# Patient Record
Sex: Male | Born: 1965 | Race: White | Hispanic: No | Marital: Married | Smoking: Never smoker
Health system: Southern US, Community
[De-identification: ages and names within clinical notes are randomized; demographics above are authoritative.]

---

## 1998-09-09 ENCOUNTER — Emergency Department (HOSPITAL_COMMUNITY): Admission: EM | Admit: 1998-09-09 | Discharge: 1998-09-09 | Payer: Self-pay

## 2005-10-09 ENCOUNTER — Encounter: Admission: RE | Admit: 2005-10-09 | Discharge: 2005-10-09 | Payer: Self-pay | Admitting: Internal Medicine

## 2005-10-10 ENCOUNTER — Ambulatory Visit: Payer: Self-pay | Admitting: Pulmonary Disease

## 2005-10-10 ENCOUNTER — Inpatient Hospital Stay (HOSPITAL_COMMUNITY): Admission: EM | Admit: 2005-10-10 | Discharge: 2005-10-18 | Payer: Self-pay | Admitting: Emergency Medicine

## 2005-10-10 ENCOUNTER — Encounter (INDEPENDENT_AMBULATORY_CARE_PROVIDER_SITE_OTHER): Payer: Self-pay | Admitting: Specialist

## 2005-10-12 ENCOUNTER — Encounter: Payer: Self-pay | Admitting: Thoracic Surgery

## 2005-10-13 ENCOUNTER — Encounter (INDEPENDENT_AMBULATORY_CARE_PROVIDER_SITE_OTHER): Payer: Self-pay | Admitting: *Deleted

## 2005-10-16 ENCOUNTER — Ambulatory Visit: Payer: Self-pay | Admitting: Dentistry

## 2005-10-16 ENCOUNTER — Encounter (INDEPENDENT_AMBULATORY_CARE_PROVIDER_SITE_OTHER): Payer: Self-pay | Admitting: Specialist

## 2005-10-17 ENCOUNTER — Encounter (INDEPENDENT_AMBULATORY_CARE_PROVIDER_SITE_OTHER): Payer: Self-pay | Admitting: *Deleted

## 2005-10-25 ENCOUNTER — Encounter: Admission: RE | Admit: 2005-10-25 | Discharge: 2005-10-25 | Payer: Self-pay | Admitting: Thoracic Surgery

## 2005-11-14 ENCOUNTER — Encounter: Admission: RE | Admit: 2005-11-14 | Discharge: 2005-11-14 | Payer: Self-pay | Admitting: Thoracic Surgery

## 2005-12-01 ENCOUNTER — Ambulatory Visit (HOSPITAL_COMMUNITY): Admission: RE | Admit: 2005-12-01 | Discharge: 2005-12-01 | Payer: Self-pay | Admitting: Thoracic Surgery

## 2005-12-01 ENCOUNTER — Encounter (INDEPENDENT_AMBULATORY_CARE_PROVIDER_SITE_OTHER): Payer: Self-pay | Admitting: Specialist

## 2006-09-24 IMAGING — CR DG CHEST 2V
2 series · 2 of 2 positions shown · non-contrast
Comparison: 11/14/05.

CLINICAL DATA: Lung cancer, pre-op bronchoscopy.  
CHEST ? 2 VIEW:

[view not recorded (1 of 2)]
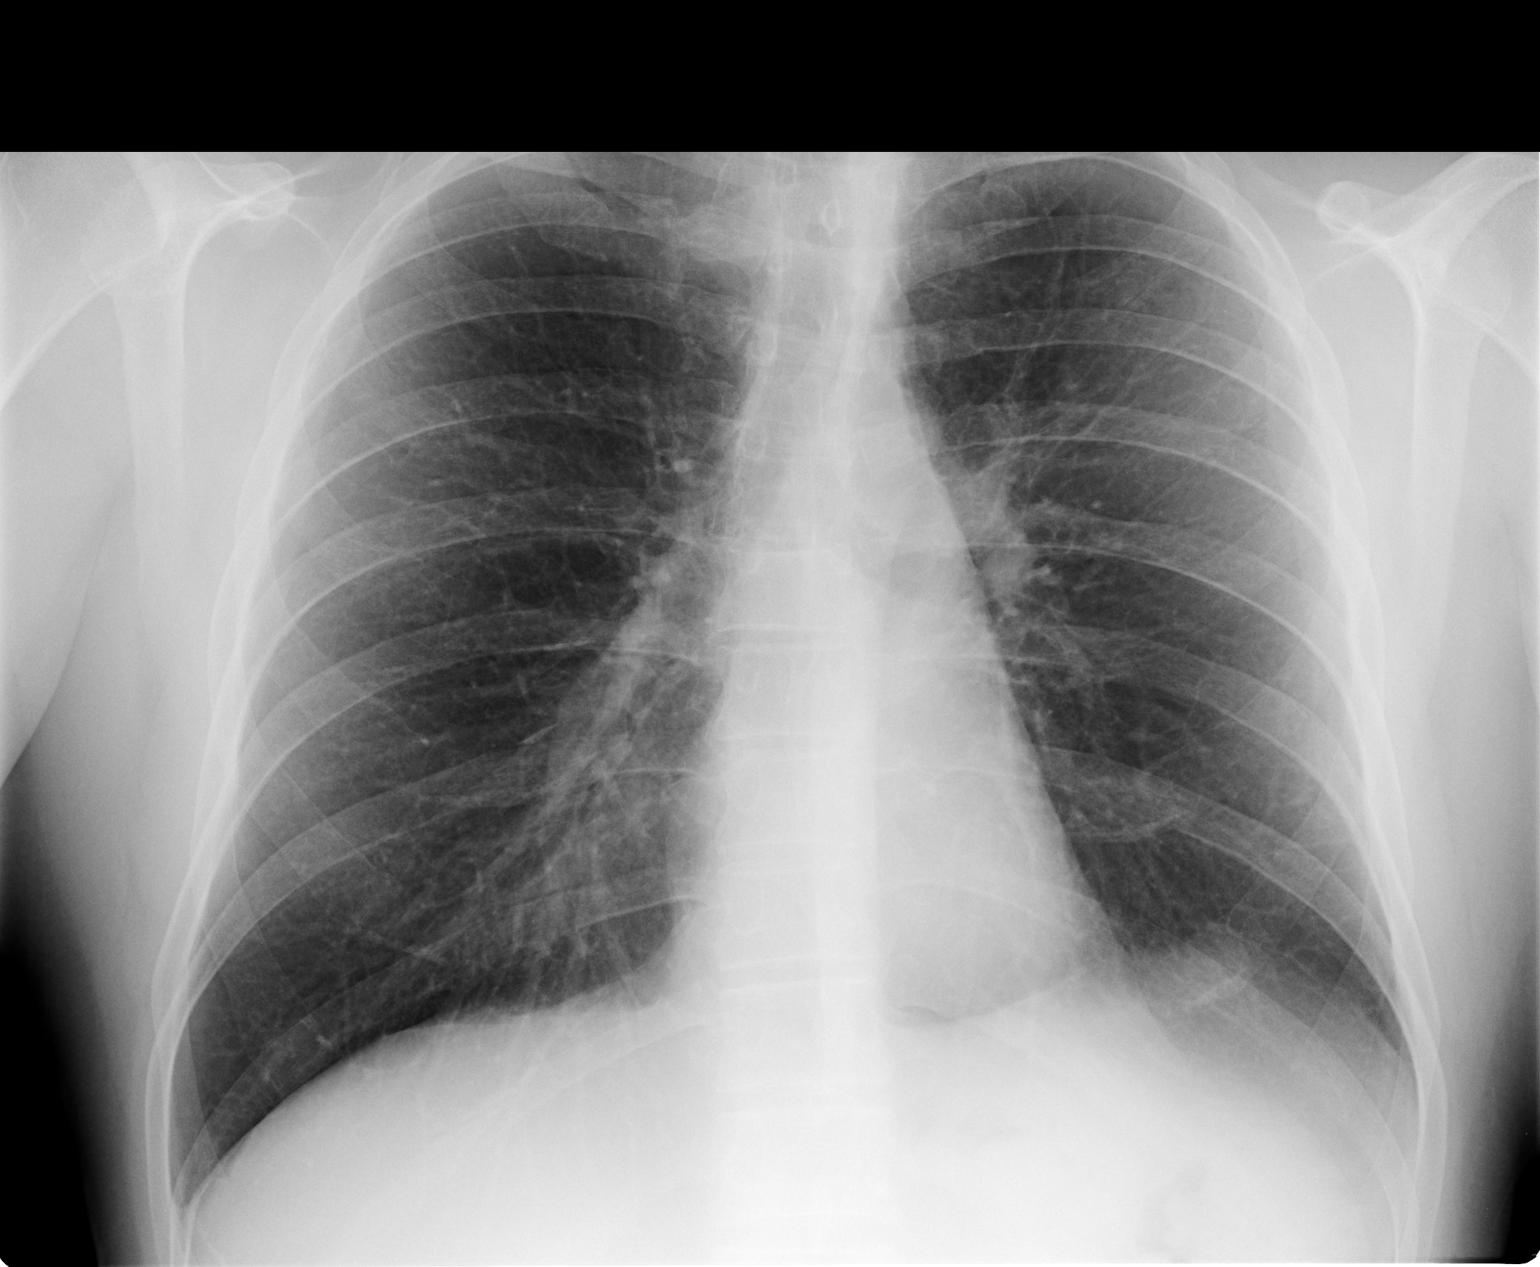

[view not recorded (2 of 2)]
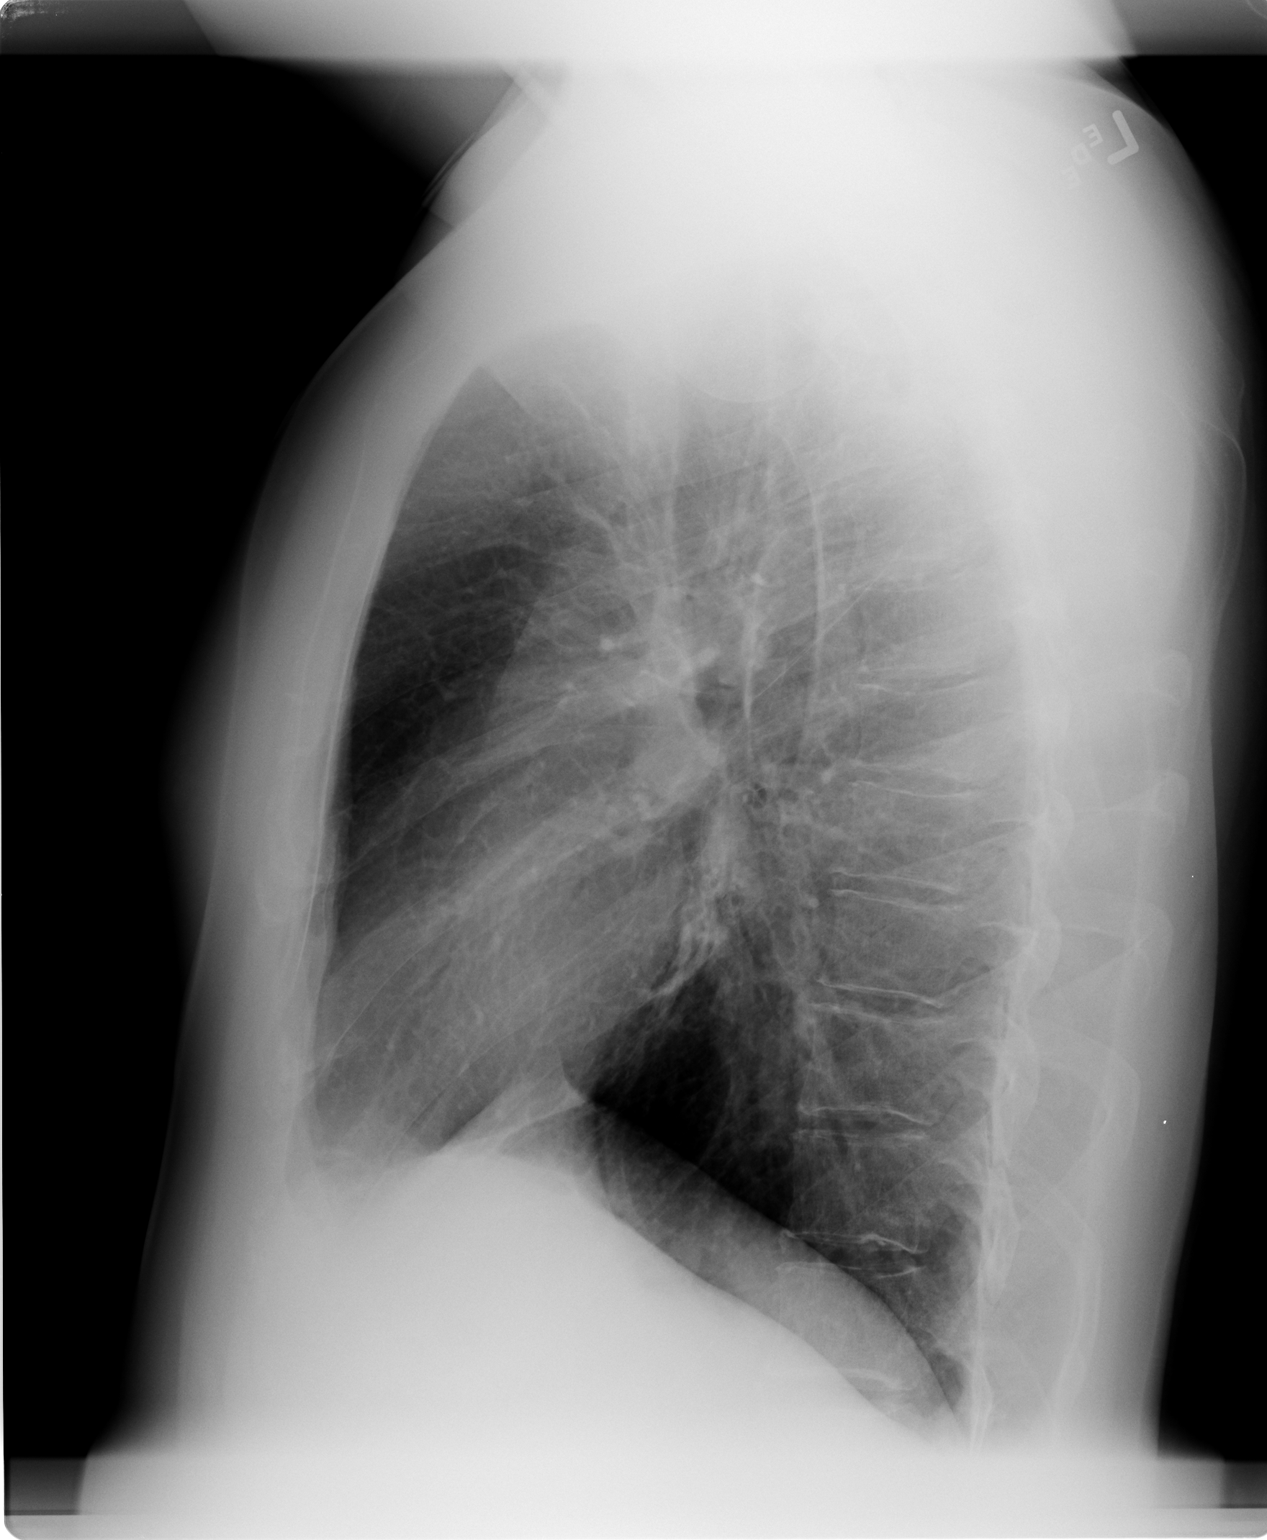

[2 of 2 positions shown; findings below may reference images not displayed]

FINDINGS: Left hilar lesion is better seen on CT chest 10/09/05.  There is mild postobstructive pneumonits in the left upper lobe.  The lungs are otherwise clear.  No pleural fluid.
IMPRESSION: Left hilar lesion is better seen on CT chest 10/09/05.  Mild left upper lobe postobstructive pneumonitis.

## 2007-01-10 ENCOUNTER — Ambulatory Visit: Payer: Self-pay | Admitting: Thoracic Surgery

## 2007-01-10 ENCOUNTER — Encounter: Admission: RE | Admit: 2007-01-10 | Discharge: 2007-01-10 | Payer: Self-pay | Admitting: Thoracic Surgery

## 2007-12-11 ENCOUNTER — Ambulatory Visit: Payer: Self-pay | Admitting: Thoracic Surgery

## 2007-12-11 ENCOUNTER — Encounter: Admission: RE | Admit: 2007-12-11 | Discharge: 2007-12-11 | Payer: Self-pay | Admitting: Thoracic Surgery

## 2008-12-09 ENCOUNTER — Ambulatory Visit: Payer: Self-pay | Admitting: Thoracic Surgery

## 2008-12-09 ENCOUNTER — Encounter: Admission: RE | Admit: 2008-12-09 | Discharge: 2008-12-09 | Payer: Self-pay | Admitting: Thoracic Surgery

## 2009-09-14 ENCOUNTER — Encounter: Admission: RE | Admit: 2009-09-14 | Discharge: 2009-09-14 | Payer: Self-pay | Admitting: Internal Medicine

## 2009-12-08 ENCOUNTER — Encounter: Admission: RE | Admit: 2009-12-08 | Discharge: 2009-12-08 | Payer: Self-pay | Admitting: Thoracic Surgery

## 2009-12-08 ENCOUNTER — Ambulatory Visit: Payer: Self-pay | Admitting: Thoracic Surgery

## 2010-04-17 ENCOUNTER — Encounter: Payer: Self-pay | Admitting: Internal Medicine

## 2010-08-09 NOTE — Assessment & Plan Note (Signed)
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  His blood  pressure is 111/78, pulse 100, respirations 18, and sats were 98%.  CT  scan was negative.  He has had no symptoms since then.  I will plan to  see him back again in 1 year with another CT scan.   [name], M.D.  Electronically Signed   DPB/MEDQ  D:  12/11/2007  T:  12/11/2007  Job:  [id number]

## 2010-08-09 NOTE — Assessment & Plan Note (Signed)
OFFICE VISIT   DRADEN, COTTINGHAM  DOB:  Aug 10, 1965                                        January 10, 2007  CHART #:  [id number]   The patient came.  Taylor Owens was supposed to be followed by Dr. Marcos Eke but  since she left town I saw him back today and got a CT scan and there is  no evidence of recurrence of his carcinoid tumor.  Taylor Owens is having some  migratory chest pain that sounds like chest wall pain.  His lungs were  clear to auscultation and percussion.  Blood pressure 121/75.  Pulse 90.  Respirations 18.  Sats are 91%.  I plan to see him back again in 6  months with a chest x-ray.   [name], M.D.  Electronically Signed   DPB/MEDQ  D:  01/10/2007  T:  01/11/2007  Job:  [id number]

## 2010-08-09 NOTE — Assessment & Plan Note (Signed)
OFFICE VISIT   Taylor Owens, Taylor Owens  DOB:  Jan 24, 1966                                        December 09, 2008  CHART #:  [id number]   His blood pressure was 125/77, pulse 89, respirations 18, sats were 98%.  Lungs clear to auscultation and percussion.   We will see him back again in 1 year with a CT scan.  His CT scan today  showed no evidence of recurrence.  He is now over 3 years since we  removed his endobronchial tumor.   [name], M.D.  Electronically Signed   DPB/MEDQ  D:  12/09/2008  T:  12/10/2008  Job:  [id number]

## 2010-08-09 NOTE — Assessment & Plan Note (Signed)
OFFICE VISIT   Taylor Owens, Taylor Owens  DOB:  [birth year]                                        December 08, 2009  CHART #:  [id number]   HISTORY OF PRESENT ILLNESS:  The patient is status post removal of  endobronchial carcinoid with laser bronchoscopy done on December 01, 2005.  The patient was last seen in the office by Dr. Edwyna Shell on  December 09, 2008.  He presents back today for his yearly followup  visit.  The patient states he has recently seen his primary care  physician due to complaints of some mild chest pain and shortness of  breath when temperatures are extremely hot.  At that time, his  practitioner told him to take a decongestant and the patient states this  did clear up.  Otherwise, the patient states he is feeling well.  Denies  any fevers, nausea, vomiting, changes in weight.   PHYSICAL EXAMINATION:  Vital Signs:  Blood pressure 131/85, pulse 92,  respirations of 16, O2 sats 98% on room air.  Respiratory:  Clear to  auscultation bilaterally.  Cardiac:  Regular rate and rhythm.   STUDIES:  The patient had a CT scan of the chest without contrast which  showed no acute findings.   IMPRESSION AND PLAN:  The patient noted to be stable at this time.  He  was seen and evaluated by Dr. Edwyna Shell.  His CT scan shows no recurrence.  Dr. Edwyna Shell will plan to follow up with the patient one more time with  repeat CT scan of the chest, and at that time will be 5 years postop.  The patient is told if he has any questions or develops any increasing  shortness of breath or weight loss or chest pain he is to contact us.  The patient is in agreement.   Sol Blazing, PA   KMD/MEDQ  D:  12/08/2009  T:  12/09/2009  Job:  [id number]

## 2010-08-12 NOTE — Discharge Summary (Signed)
NAMEEIN, RIJO NO.:  [id number]   MEDICAL RECORD NO.:  [id number]          PATIENT TYPE:  INP   LOCATION:  2014                         FACILITY:  [facility]   PHYSICIAN:  Shan Levans, M.D. 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   HISTORY OF PRESENT ILLNESS:  Mr. Taylor Owens is a 45 year old white male,  who for several weeks had increased cough, a lump-like sensation to the  point where he has had hemoptysis in the evening with pleuritic chest pain  and acute respiratory distress.  He was initially brought to the [facility] Emergency Department.  He required IV morphine, 100% non-re-  breather to maintain proper saturation.  Chest x-ray and CT scan  demonstrated collapse of the left lung with left main stem interbronchial  lesion.  Of note, the patient was an active smoker.  Pulmonary critical care  was called in to assess and further evaluate his left main stem bronchus  lesion.   LABORATORY DATA:  Respiratory culture shows no growth at 2 days.  Arterial  blood gas - pH 7.40, PCO2 33, with bicarb of 21 with base deficit of 3.  WBC  6.2, hemoglobin 12.3, hematocrit 36.5 and platelets 298.  INR 1.  PTT 27.  Sodium 140, potassium 3.9, chloride 106, CO2 is 30, glucose 88, BUN 3,  creatinine 0.7, calcium 8.8, AST 26, ALT 33, ALP is 86, total bilirubin 0.5,  respiratory culture again demonstrates no growth.  Pathology results from  left main stem bronchus biopsy demonstrates a carcinoid tumor.  Chest x-ray  shows no pneumothorax following thoracentesis.  A 12-lead EKG is normal  sinus rhythm with heart rate 101.   HOSPITAL COURSE BY DISCHARGE DIAGNOSES:  1. Left main stem bronchus blockage.  The patient was taken initially to      endoscopic lab on July 17th and  fiberoptic bronchoscopy per Dr. Danice Goltz was performed with airway inspection and observation of      carcinoid tumor in the left main stem that was blocking and creating a      collapse.  At this time, he was set up for a followup in the operating      room with Dr. Edwyna Shell in attendance.  Initially had excision of      carcinoid tumor of left main stem with snare and cautery.  Dr. Edwyna Shell      then performed a laser resection of the stump.  This was done to the      left main stem bronchus stump.  He was successfully awakened from      anesthesia and transferred to post-anesthesia care unit.  He required      ultrasound-guided thoracentesis on July 24 to remove 400 mL of straw      fluid from the left chest.  His hemoptysis and chest pain have  improved.  He has reached maximum hospital benefit.  He will be      discharged to home in improved condition today.  He will follow up with      Dr. Danice Goltz on August 1 at 9:50 a.m. for further evaluation and      treatment and Dr. Edwyna Shell within 1 week in his office, will call and set      this appointment up.  2. Left inguinal node which he underwent ultrasound biopsy on March 25.      This was found to have spindle cells in it.  He had no further      complications from left inguinal node.  He is being again discharged      home in improved condition.   DISCHARGE MEDICATIONS:  1. Avelox 400 mg a day until gone.  2. Advair 250/50, 1 puff b.i.d.  3. Amitriptyline 50 mg q.h.s.  4. Valium 5 mg, 1-1/2 tablets daily p.r.n.   He has a followup appointment with Dr. Jayme Cloud on August 1 at 9:50 a.m. and  with Dr. Edwyna Shell in 1 week.   DIET:  As tolerated.   SPECIAL INSTRUCTIONS:  No smoking.   DISPOSITION/CONDITION ON DISCHARGE:  Improved.      Brett Canales Minor, A.C.N.P. [facility]      Shan Levans, M.D. [facility]  Electronically Signed    SM/MEDQ  D:  10/18/2005  T:  10/18/2005  Job:  [id number]

## 2010-08-12 NOTE — Op Note (Signed)
NAMEMCARTHUR, IVINS               ACCOUNT NO.:  [id number]   MEDICAL RECORD NO.:  [id number]          PATIENT TYPE:  OUT   LOCATION:  XRAY                         FACILITY:  [facility]   PHYSICIAN:  [name], M.D. DATE OF BIRTH:  November 24, 1965   DATE OF PROCEDURE:  DATE OF DISCHARGE:  10/12/2005                                 OPERATIVE REPORT   PREOPERATIVE DIAGNOSES:  Atelectasis, left lung secondary to endobronchial  carcinoid.   POSTOPERATIVE DIAGNOSES:  Atelectasis, left lung secondary to endobronchial  carcinoid.   OPERATION PERFORMED:  Removal of endobronchial carcinoid with laser  bronchoscopy.   SURGEON:  [name], M.D. and also Danice Goltz, M.D. [facility]   After general anesthesia, the video bronchoscope was passed through the  endotracheal tube.  The distal left main stem bronchus, the right upper  lobe, right middle lobe and right lower lobe orifices were normal.  The  carina was in the midline.  The left main stem had an almost total  obstruction of the left main stem bronchus.  We were able to get around it  and see the left lower lobe and remove some secretions from the left lower  lobe.  Then, Dr. Jayme Cloud did an endobronchial resection of these.  The  tumor, using a snare with electrocautery, when we were able to get down to  the base, and at that time, we came back with the Nd:YAG laser set at 25  watt seconds continuous, free beam style and lasered the base of this.  The  lesion came off the subcarinal on the right upper lobes side.  The patient  tolerated the procedure well, was returned to recovery room in stable  condition.           ______________________________  [name], M.D.     DPB/MEDQ  D:  10/13/2005  T:  10/13/2005  Job:  [id number]   cc:   Danice Goltz, M.D. [facility]  [address] [place], [place] [address]

## 2010-08-12 NOTE — Op Note (Signed)
Taylor Owens, Taylor Owens               ACCOUNT NO.:  [id number]   MEDICAL RECORD NO.:  [id number]          PATIENT TYPE:  AMB   LOCATION:  SDS                          FACILITY:  [facility]   PHYSICIAN:  [name], M.D. DATE OF BIRTH:  November 04, 1965   DATE OF PROCEDURE:  12/01/2005  DATE OF DISCHARGE:                                 OPERATIVE REPORT   PREOPERATIVE DIAGNOSIS:  Status post excision of endobronchial carcinoid  tumor.   POSTOPERATIVE DIAGNOSIS:  Status post excision of endobronchial carcinoid  tumor.   OPERATION PERFORMED:  Video bronchoscopy with laser standby.   SURGEON:  [name], M.D.   ANESTHESIA:  General anesthesia.   DESCRIPTION OF PROCEDURE:  Under general anesthesia, the video bronchoscope  was passed through the endotracheal tube.  The carina was in the midline.  The right middle lobe, right lower lobe and right upper lobe orifices were  normal.  The right main stem bronchus was normal.  The left main stem  bronchus was normal down to subcarina between the left upper lobe and left  lower lobe orifices; there was no evidence of tumor.  This was where it  originated originally, which we had excised and then lasered the stump.  The  left upper lobe orifices were normal, and the right and left lower lobe  orifices.  Pictures were taken and biopsies were taken of the area where the  stump had been.  The patient was returned to the recovery room in stable  condition.           ______________________________  [name], M.D.     DPB/MEDQ  D:  12/01/2005  T:  12/01/2005  Job:  [id number]   cc:   Gailen Shelter, MD  Jovita Gamma, MD

## 2010-08-12 NOTE — Op Note (Signed)
NAMETRACE, WIRICK NO.:  [id number]   MEDICAL RECORD NO.:  [id number]          PATIENT TYPE:  INP   LOCATION:  1826                         FACILITY:  [facility]   PHYSICIAN:  Danice Goltz, M.D. LHCDATE OF BIRTH:  1965/08/20   DATE OF PROCEDURE:  10/10/2005  DATE OF DISCHARGE:                                 OPERATIVE REPORT   PROCEDURE:  Video bronchoscopy.   This is a 45 year old white male who presented to the emergency room today  because of chest pain and difficulty breathing.  He was noted to have on  evaluation atelectatic left lung and on CT which showed to have a left  mainstem bronchus mass.  Bronchoscopy is done to try to evaluate same.   DESCRIPTION OF PROCEDURE:  The patient was taken to the endoscopy suite  after he understood the benefits, limitations and potential complications of  procedure.  At this point the patient was given conscious sedation.  A total  of 7.5 mg of Versed and fentanyl 150 mcg IV were given throughout the case.  The patient received Cetacaine topical to the posterior pharynx as well as  lidocaine 2% total of 40 mL as bronchial lavage to curtail cough reflex.  The patient had the bronchoscope inserted via the oral route.  Vocal cords  were normal.  Trachea was normal.  Carina was sharp.  Right tracheobronchial  tree was entirely normal.  The bronchoscope was then placed with attention  to the left mainstem bronchus and around midpoint of the mainstem bronchus  pedunculated globular very vascular lesion could be seen.  This was friable  and would bleed very easily upon touching.  At this point it was determined  that the best option would be to try to biopsy and cauterize at the same  time.  Attempts were made to snare and cauterized the lesion but this was  difficult due to the patient's intractable coughing.  At this point a  significant amount of heme could be seen and hemostasis was achieved by  tamponading the area  instilling epinephrine 1:10,000 solution and using  thrombin.  At this point then hot biopsy forceps were used and a biopsy  sample was done x1. Again because of the patient's intractable coughing and  because of heme the procedure had to be terminated.  The area was lavaged  and again thrombin instilled until good hemostasis was achieved.  The  patient was taken to the recovery room in satisfactory condition.  The  patient had desaturations to 84% during the procedure while off oxygen for  the cautery portion of the procedure.  This immediately returned to normal  by placing the patient on oxygen.  The patient was taken to the recovery  area in no acute distress with no overt complications  evident. Chest x-ray is pending.  Total blood loss was estimated 50-60 mL.  The patient's family will be apprised of the findings. The patient will  remain admitted. Dr. Karle Plumber of [facility] has been informed of the above  findings and will see the patient in consultation.  ______________________________  Danice Goltz, M.D. [facility]     LG/MEDQ  D:  10/10/2005  T:  10/10/2005  Job:  [id number]   cc:   [name], M.D.  [address]  [place]  [place] [address]   Shan Levans, M.D. [facility]  [address]  [place]  [place] [address]

## 2010-08-12 NOTE — Op Note (Signed)
NAMEJERMANIE, MINSHALL NO.:  [id number]   MEDICAL RECORD NO.:  [id number]          PATIENT TYPE:  OUT   LOCATION:  XRAY                         FACILITY:  [facility]   PHYSICIAN:  Danice Goltz, M.D. 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   PREOPERATIVE DIAGNOSIS:  The patient has a known carcinoid tumor of the left  mainstem bronchus with total atelectasis of the left lung.   POSTOPERATIVE DIAGNOSIS:  Not given.   HISTORY:  Mr. Danzell Birky is a 45 year old, current smoker who presented  on July 17 with dyspnea and atelectasis of the left lung.  Subsequent  bronchoscopy showed that the patient had a left mainstem mass which was  pedunculated.  Attempts at biopsying that mass initially were hampered by  the fact that the mass was very friable and prone to bleed. Hot forceps  biopsies were done and the tumor was confirmed to be a carcinoid tumor.  The  patient was being considered for resection of the tumor; however, he  developed complete atelectasis of the left lung with postobstructive  pneumonia and drowned lung syndrome.  Because of this, it was determined  that the best course of action would be debulk or at least try to excise the  tumor in toto using an endobronchial approach and allowing the patient to  clear his postobstructive pneumonia and further evaluate him after that. The  patient is maintained on IV antibiotics at present.   The procedure was described to the patient along with the limitations,  potential complications and benefits of the same.  The patient agreed to  proceed.  The patient was taken to the operating room for the same and the  procedure was done under general anesthesia with full ventilatory support.  The surgeons were Dr. Jayme Cloud and Dr. Edwyna Shell. The procedure was done  in two-  step fashion. I performed the first part of the procedure with Dr. Edwyna Shell  performing the laser bronchoscopy part of the procedure, please refer to his  dictation for details.   FIRST ASSISTANT FOR FIRST PROCEDURE:  Mr. Alain Honey, RN.   PROCEDURE:  The patient was under general anesthesia and the Pentax video  bronchoscope advanced through the Portex adaptor and immediately could be  seen the large pedunculated left mainstem mass occluding the bronchus.  At  this point, it was determined that the best way to try to resect this would  be to try to snare and cauterize the lesion.  A mini cautery snare was first  used but the mass was too large to be completely ensnared by this device.  At this point, this was set aside and we used a standard polypectomy snare  27 mm x 240 cm with cautery set at 20 watt seconds.  At this point, the  lesion was snared using the standard snare and once the tumor was ensnared,  coagulation power was set and the tumor was  bisected by this.  The snare was  then retrieved and the bronchoscope was then inserted to evaluate the  result.  At this point, tumor mass could be seen protruding in the left  main. This was suctioned up to the endotracheal tube level.  The mass could  not be retrieved at this point, so the bronchoscope was then set to full  suction of the tumor and the patient was extubated with the bronchoscope  still in place with the tumor as well.  The patient was then reintubated and  the tumor was then sent for pathology.  This was relatively large tumor mass  which encompassed the whole diameter of the interior of the endotracheal  tube. Note is made that the patient was on FIO2 of 35% during the procedure.  This was to prevent an airway fire.  At this point after the patient had  been reintubated, the bronchoscope was then reinserted and again residual  tumor mass could be seen. Again a second snare procedure was done with  cautery at 20  watt seconds.  The tumor mass this time fell on the right  mainstem and this was retrieved by using a zero-tip airway retrieval basket  and the tumor was removed from the airway.  At this point, the airway was  examined.  There was areas of inflammation.  The stalk of the tumor could be  seen arising from the secondary carina between the right upper lobe bronchus  orifice and the left lower lobe bronchus orifice.  There was still some  residual tumor left and at this point, this residual was snared with the  mini cautery snare and successfully removed.  The remainder of the stalk  from the tumor it was deemed would be best dealt with with laser  bronchoscopy and coagulation of that area.  At this point having completed  this portion of the procedure, Dr. Edwyna Shell proceeded with the laser  bronchoscopy portion of the procedure.  Please refer to his dictation for  full details.   IMPRESSION:  Carcinoid tumor of the left mainstem bronchus with total  atelectasis of the left lung status post successful excision of same with  snare cautery x3 and basket retrieval tumor.  The patient also received  coagulation with laser per Dr. Edwyna Shell as noted above.   DISPOSITION:  The patient was sent to the recovery area with no apparent  complications noted.  Chest x-ray is pending.  The patient will be  transferred to the step-down unit for further close monitoring overnight.  The patient shall remain on antibiotics given the atelectatic difficulties  and postobstructive pneumonia.           ______________________________  Danice Goltz, M.D. [facility]     LG/MEDQ  D:  10/13/2005  T:  10/13/2005  Job:  [id number]
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
## 2010-08-12 NOTE — H&P (Signed)
Taylor Owens, Taylor Owens NO.:  [id number]   MEDICAL RECORD NO.:  [id number]           PATIENT TYPE:   LOCATION:                                 FACILITY:   PHYSICIAN:  Shan Levans, M.D. [facility]    DATE OF BIRTH:   DATE OF ADMISSION:  10/10/2005  DATE OF DISCHARGE:                                HISTORY & PHYSICAL   CHIEF COMPLAINT:  Hemoptysis, chest pain.   HISTORY OF PRESENT ILLNESS:  A 45 year old white male whose had for the past  several weeks increased cough and then a lump-like sensation progressing to  the point where he has had hemoptysis this evening, pleuritic chest pain,  acute respiratory distress, brought in the emergency room. He settled with  IV morphine and 100% nonrebreather. Chest x-ray and CT scan shows collapse  of the left lung with a left main stem endobronchial lesion. The patient is  an active smoker. No other significant past medical history except for  irritable bowel syndrome.   PAST MEDICAL HISTORY:  Irritable bowel syndrome only.   PAST SURGICAL HISTORY:  None.   MEDICATIONS PRIOR TO ADMISSION:  1.  Amitriptyline 25 mg h.s.  2.  Valium p.r.n.   MEDICATION ALLERGIES:  None.   SOCIAL HISTORY:  Works as a [employer]arry manager, is married, does actively  smoke a pack a day.   FAMILY HISTORY:  Noncontributory.   REVIEW OF SYSTEMS:  Otherwise noncontributory.   PHYSICAL EXAMINATION:  GENERAL:  This youthful appearing male in no acute  distress currently was agitated with increased respiratory distress earlier,  settled with morphine.  VITAL SIGNS:  Temperature 97, blood pressure 131/80, pulse 107, respirations  20, saturation initially 79% now 94% on 100% nonrebreather.  CHEST:  Decreased breath sounds on the left, clear on the right, no rhonchi  or rales.  CARDIAC:  Regular rate and rhythm without S3, normal S1, S2.  ABDOMEN:  Soft, nontender.  EXTREMITIES:  No edema or clubbing or venous disease.  SKIN:  Clear.  NEUROLOGIC:   Intact.  HEENT:  No jugular venous distention, no lymphadenopathy. Oropharynx clear.  NECK:  Supple.   LABORATORY DATA:  Sodium 140, potassium 4.1, chloride 109, BUN 12, blood  sugar 93. Creatinine 1, PT/PTT normal. Liver functions unremarkable. White  count 9.5, hemoglobin 15.9.   Chest x-ray shows collapse of the left lung. CT scan of the chest done one  day previous showed left main stem endobronchial lesion and collapse of the  left lung.   IMPRESSION:  Probable malignancy left main stem bronchus with now total  collapse and associated hemoptysis.   RECOMMENDATIONS:  Administer IV Rocephin, placed the patient n.p.o. and prep  patient for potential bronchoscopy later this morning. May require also  eventually rigid bronchoscopy for more direct examination and treatment  pending results of fiberoptic bronchoscopy.      Shan Levans, M.D. [facility]  Electronically Signed     PW/MEDQ  D:  10/10/2005  T:  10/10/2005  Job:  [id number]

## 2010-11-16 ENCOUNTER — Other Ambulatory Visit: Payer: Self-pay | Admitting: Thoracic Surgery

## 2010-11-16 DIAGNOSIS — D381 Neoplasm of uncertain behavior of trachea, bronchus and lung: Secondary | ICD-10-CM

## 2010-12-21 ENCOUNTER — Other Ambulatory Visit: Payer: Self-pay

## 2010-12-21 ENCOUNTER — Ambulatory Visit: Payer: Self-pay | Admitting: Thoracic Surgery

## 2015-11-16 ENCOUNTER — Other Ambulatory Visit: Payer: Self-pay | Admitting: Internal Medicine

## 2015-11-16 ENCOUNTER — Ambulatory Visit
Admission: RE | Admit: 2015-11-16 | Discharge: 2015-11-16 | Disposition: A | Discharge status: Home / Self Care | Payer: BLUE CROSS/BLUE SHIELD | Source: Ambulatory Visit | Attending: Internal Medicine | Admitting: Internal Medicine

## 2015-11-16 DIAGNOSIS — M542 Cervicalgia: Secondary | ICD-10-CM

## 2015-12-06 ENCOUNTER — Ambulatory Visit: Payer: BLUE CROSS/BLUE SHIELD | Attending: Internal Medicine

## 2015-12-06 DIAGNOSIS — R293 Abnormal posture: Secondary | ICD-10-CM | POA: Diagnosis present

## 2015-12-06 DIAGNOSIS — M542 Cervicalgia: Secondary | ICD-10-CM | POA: Diagnosis present

## 2015-12-06 DIAGNOSIS — M79601 Pain in right arm: Secondary | ICD-10-CM | POA: Diagnosis not present

## 2015-12-06 DIAGNOSIS — R252 Cramp and spasm: Secondary | ICD-10-CM | POA: Diagnosis present

## 2015-12-06 NOTE — Therapy (Signed)
[place], [place], [address] Phone: [phone]   Fax:  [phone]  Physical Therapy Evaluation  Patient Details  Name: Taylor Owens MRN: [id number] Date of Birth: 14-Feb-1966 Referring Provider: Jilda Panda, MD  Encounter Date: 12/06/2015      PT End of Session - 12/06/15 1443    Visit Number 1   Number of Visits 12   Date for PT Re-Evaluation 01/17/16   Authorization Type BCBS   PT Start Time [id number]   PT Stop Time 0340   PT Time Calculation (min) 53 min   Activity Tolerance Patient tolerated treatment well;No increased pain   Behavior During Therapy [facility] for tasks assessed/performed      No past medical history on file.  No past surgical history on file.  There were no vitals filed for this visit.       Subjective Assessment - 12/06/15 1457    Subjective He reports onset of pain with quickly turning neck to RT and began to have pain in neck and RT arm. A few days later he said arm was on  fire with severe pain. MD issued medication . Now with no feeling in  ulnar aspect of RT forearm. Neck pain to axilla is 2-3/10 constant, sore   Limitations --   no limits   Diagnostic tests MRI   Currently in Pain? Yes   Pain Score 3    Pain Location Neck   Pain Orientation Right   Pain Descriptors / Indicators Sore   Pain Type Acute pain   Pain Onset 1 to 4 weeks ago   Pain Frequency Constant   Aggravating Factors  Lifting, turning head , AM worse   Pain Relieving Factors Medications   Multiple Pain Sites Yes            [facility] PT Assessment - 12/06/15 1441      Assessment   Medical Diagnosis RT sided neck pian , numbness into RT forearm   Referring Provider Jilda Panda, MD   Onset Date/Surgical Date --  4 weeks ago   Next MD Visit 03/2016   Prior Therapy no     Precautions   Precautions None     Restrictions   Weight Bearing Restrictions No     Balance Screen   Has the patient  fallen in the past 6 months Yes   How many times? 1   Has the patient had a decrease in activity level because of a fear of falling?  No   Is the patient reluctant to leave their home because of a fear of falling?  No     Prior Function   Level of Independence Independent     Cognition   Overall Cognitive Status Within Functional Limits for tasks assessed     Observation/Other Assessments   Focus on Therapeutic Outcomes (FOTO)  43% limited     Posture/Postural Control   Posture Comments decreased cervicla lordosis     ROM / Strength   AROM / PROM / Strength AROM;Strength     AROM   AROM Assessment Site Cervical   Cervical Flexion 50   Cervical Extension 60   Cervical - Right Side Bend 22   Cervical - Left Side Bend 32   Cervical - Right Rotation 58   Cervical - Left Rotation 62     Strength   Overall Strength Comments Normal neck and UE  Some pain in neck with RT shoulder abduction  Palpation   Palpation comment tender RT paraspinals mid to lower neck  and levator      Special Tests    Special Tests Cervical   Cervical Tests Spurling's;Dictraction     Spurling's   Findings Positive   Side Right     Distraction Test   Findngs Negative   side Right     Ambulation/Gait   Gait Comments WNL                   [facility] Adult PT Treatment/Exercise - 12/06/15 1441      Modalities   Modalities Traction     Traction   Type of Traction Cervical   Min (lbs) 5   Max (lbs) 12   Hold Time 60   Rest Time 15   Time 12                PT Education - 12/06/15 1600    Education provided Yes   Education Details POC ,review of xrays and report, time frame for sensation to return (6 months?)  , possible post traction feeling    Person(s) Educated Patient   Methods Explanation   Comprehension Verbalized understanding          PT Short Term Goals - 12/06/15 1446      PT SHORT TERM GOAL #1   Title He will be independent with intial HEP   Time 3    Period Weeks   Status New     PT SHORT TERM GOAL #2   Title He will be able to demo understanding of good posture   Time 3   Period Weeks   Status New     PT SHORT TERM GOAL #3   Title He will improve   cervical   rotation to  to 65 degrees or better to allow for painfree looking behind back   Time 3   Period Weeks   Status New     PT SHORT TERM GOAL #4   Title RT neck pain decreased to intermittant in nature   Time 3   Period Weeks   Status New           PT Long Term Goals - 12/06/15 1447      PT LONG TERM GOAL #1   Title He will be independent with all hEP issued   Time 6   Period Weeks   Status New     PT LONG TERM GOAL #2   Title He will report RT forearm symptoms as intermittant   Time 6   Period Weeks   Status New     PT LONG TERM GOAL #3   Title He will report neck pain improved  and become intermittant   Time 6   Period Weeks   Status New     PT LONG TERM GOAL #4   Title He will be able to decreased pain   75% or more with all normal activity     Time 6   Period Weeks   Status New               Plan - 12/06/15 1443    Clinical Impression Statement Taylor Owens presents for low complexity evaluation with RT sided neck pain and symptoms into RT forearm. He has pain with spasm, decreased motion, forward head posture, limiting normal activity   Rehab Potential Good   PT Frequency 2x / week   PT Duration 6 weeks   PT  Treatment/Interventions Cryotherapy;Electrical Stimulation;Moist Heat;Traction;Ultrasound;Passive range of motion;Patient/family education;Manual techniques;Taping;Therapeutic exercise;Dry needling   PT Next Visit Plan Modalities for pain, HEP for posture, manual, traction if no negative effects, [place] prior to traction   Consulted and Agree with Plan of Care Patient      Patient will benefit from skilled therapeutic intervention in order to improve the following deficits and impairments:  Pain, Postural dysfunction, Decreased  activity tolerance, Decreased range of motion, Increased muscle spasms  Visit Diagnosis: Cervicalgia - Plan: PT plan of care cert/re-cert  Abnormal posture - Plan: PT plan of care cert/re-cert  Cramp and spasm - Plan: PT plan of care cert/re-cert     Problem List There are no active problems to display for this patient.   Taylor Owens  PT 12/06/2015, 4:12 PM  [facility] [address] [place], [place], [address] Phone: [phone]   Fax:  [phone]  Name: Taylor Owens MRN: [id number] Date of Birth: 07/13/1965

## 2015-12-13 ENCOUNTER — Ambulatory Visit: Payer: BLUE CROSS/BLUE SHIELD

## 2015-12-13 DIAGNOSIS — R252 Cramp and spasm: Secondary | ICD-10-CM

## 2015-12-13 DIAGNOSIS — M542 Cervicalgia: Secondary | ICD-10-CM

## 2015-12-13 DIAGNOSIS — R293 Abnormal posture: Secondary | ICD-10-CM

## 2015-12-13 NOTE — Therapy (Signed)
[place], [place], [address] Phone: [phone]   Fax:  [phone]  Physical Therapy Treatment  Patient Details  Name: Taylor Owens MRN: [id number] Date of Birth: [birth year] Referring Provider: Jilda Panda, MD  Encounter Date: 12/13/2015      PT End of Session - 12/13/15 1219    Visit Number 2   Number of Visits 12   Date for PT Re-Evaluation 01/17/16   Authorization Type BCBS   PT Start Time [id number]   PT Stop Time 1230   PT Time Calculation (min) 45 min   Activity Tolerance Patient tolerated treatment well;No increased pain   Behavior During Therapy [facility] for tasks assessed/performed      No past medical history on file.  No past surgical history on file.  There were no vitals filed for this visit.      Subjective Assessment - 12/13/15 1148    Subjective Arm symptoms eased ok. No ill effects from traction last time   Patient Stated Goals Ease pain    Currently in Pain? Yes   Pain Score 3    Pain Location Neck   Pain Orientation Right   Pain Descriptors / Indicators Sore   Pain Type Acute pain   Pain Onset More than a month ago   Pain Frequency Constant   Aggravating Factors  turning head    Pain Relieving Factors meds   Multiple Pain Sites No                         [facility] Adult PT Treatment/Exercise - 12/13/15 1217      Neck Exercises: Machines for Strengthening   UBE (Upper Arm Bike) L2 3 min for 3 back     Modalities   Modalities Ultrasound     Ultrasound   Ultrasound Location RT neck   Ultrasound Parameters 100% 1.5 Wcm2, 1MHz     Traction   Type of Traction Cervical   Min (lbs) 5   Max (lbs) 15   Hold Time 90   Rest Time 15   Time 16                  PT Short Term Goals - 12/06/15 1446      PT SHORT TERM GOAL #1   Title He will be independent with intial HEP   Time 3   Period Weeks   Status New     PT SHORT TERM GOAL #2   Title He  will be able to demo understanding of good posture   Time 3   Period Weeks   Status New     PT SHORT TERM GOAL #3   Title He will improve   cervical   rotation to  to 65 degrees or better to allow for painfree looking behind back   Time 3   Period Weeks   Status New     PT SHORT TERM GOAL #4   Title RT neck pain decreased to intermittant in nature   Time 3   Period Weeks   Status New           PT Long Term Goals - 12/06/15 1447      PT LONG TERM GOAL #1   Title He will be independent with all hEP issued   Time 6   Period Weeks   Status New     PT LONG TERM GOAL #2   Title He  will report RT forearm symptoms as intermittant   Time 6   Period Weeks   Status New     PT LONG TERM GOAL #3   Title He will report neck pain improved  and become intermittant   Time 6   Period Weeks   Status New     PT LONG TERM GOAL #4   Title He will be able to decreased pain   75% or more with all normal activity     Time 6   Period Weeks   Status New               Plan - 12/13/15 1219    Clinical Impression Statement No worse with traction with incr pull.  A little sore with pressure on neck but no pain. Will progress if  no ill effects from traction   PT Treatment/Interventions Cryotherapy;Electrical Stimulation;Moist Heat;Traction;Ultrasound;Passive range of motion;Patient/family education;Manual techniques;Taping;Therapeutic exercise;Dry needling   PT Next Visit Plan modalities , progress traction, exercies . stab neck   Consulted and Agree with Plan of Care Patient      Patient will benefit from skilled therapeutic intervention in order to improve the following deficits and impairments:  Pain, Postural dysfunction, Decreased activity tolerance, Decreased range of motion, Increased muscle spasms  Visit Diagnosis: Cervicalgia  Abnormal posture  Cramp and spasm     Problem List There are no active problems to display for this patient.   Darrel Hoover   PT 12/13/2015, 12:21 PM  [facility] [address] [place], [place], [address] Phone: [phone]   Fax:  [phone]  Name: Taylor Owens MRN: [id number] Date of Birth: 07-23-1965

## 2015-12-17 ENCOUNTER — Ambulatory Visit: Payer: BLUE CROSS/BLUE SHIELD | Admitting: Physical Therapy

## 2015-12-17 DIAGNOSIS — M542 Cervicalgia: Secondary | ICD-10-CM | POA: Diagnosis not present

## 2015-12-17 DIAGNOSIS — R293 Abnormal posture: Secondary | ICD-10-CM

## 2015-12-17 DIAGNOSIS — R252 Cramp and spasm: Secondary | ICD-10-CM

## 2015-12-17 NOTE — Therapy (Signed)
[place], [place], [address] Phone: [phone]   Fax:  [phone]  Physical Therapy Treatment  Patient Details  Name: Taylor Owens MRN: [id number] Date of Birth: May 07, 1965 Referring Provider: Jilda Panda, MD  Encounter Date: 12/17/2015      PT End of Session - 12/17/15 0849    Visit Number 3   Number of Visits 12   Date for PT Re-Evaluation 01/17/16   Authorization Type BCBS   PT Start Time 4680   PT Stop Time 0935   PT Time Calculation (min) 51 min      No past medical history on file.  No past surgical history on file.  There were no vitals filed for this visit.                       [place] Adult PT Treatment/Exercise - 12/17/15 0001      Neck Exercises: Machines for Strengthening   UBE (Upper Arm Bike) L2 3 min for 3 back     Neck Exercises: Supine   Neck Retraction 10 reps   Other Supine Exercise supine scap stab red band x 10 each, poor motor control visible      Ultrasound   Ultrasound Location RT upper trap    Ultrasound Parameters 100% 1.5 w/cm2 1 mhz   Ultrasound Goals Pain     Traction   Type of Traction Cervical   Min (lbs) 5   Max (lbs) 12   Hold Time 20   Rest Time 10   Time 10     Manual Therapy   Manual Therapy Joint mobilization   Joint Mobilization Right scapular mobs in sidelying. Rt shoulder PROM supine, small Right IR limitation verses left with mild increase in pain.      Neck Exercises: Stretches   Other Neck Stretches sleeper stretch x 3 in sidelying                  PT Short Term Goals - 12/17/15 3212      PT SHORT TERM GOAL #1   Title He will be independent with intial HEP   Time 3   Period Weeks   Status On-going     PT SHORT TERM GOAL #2   Title He will be able to demo understanding of good posture   Time 3   Period Weeks   Status On-going     PT SHORT TERM GOAL #3   Title He will improve   cervical    rotation to  to 65 degrees or better to allow for painfree looking behind back   Time 3   Period Weeks   Status Unable to assess     PT SHORT TERM GOAL #4   Title RT neck pain decreased to intermittant in nature   Time 3   Period Weeks   Status Achieved           PT Long Term Goals - 12/06/15 1447      PT LONG TERM GOAL #1   Title He will be independent with all hEP issued   Time 6   Period Weeks   Status New     PT LONG TERM GOAL #2   Title He will report RT forearm symptoms as intermittant   Time 6   Period Weeks   Status New     PT LONG TERM GOAL #3   Title He will report neck pain  improved  and become intermittant   Time 6   Period Weeks   Status New     PT LONG TERM GOAL #4   Title He will be able to decreased pain   75% or more with all normal activity     Time 6   Period Weeks   Status New               Plan - 12/17/15 0849    Clinical Impression Statement Pt reports he sis slowy improving with pain levels 2-3/10 at most and intermittent. STG# 4 Met. Most symptoms right upper trap and under right shoulder blade fist rise in the morning and then improves. began cervical/ scap stabilization exercises with pt demonstrating poor motor control. Repeated US to right upper trap with pt reporting decrease tightness. Repeated cervical tracion as previous.    PT Next Visit Plan modalities , progress traction, exercies . stab neck- repeat red band scap stab and add to HEP if tolerated well; check STGs      Patient will benefit from skilled therapeutic intervention in order to improve the following deficits and impairments:  Pain, Postural dysfunction, Decreased activity tolerance, Decreased range of motion, Increased muscle spasms  Visit Diagnosis: Cervicalgia  Abnormal posture  Cramp and spasm     Problem List There are no active problems to display for this patient.   Dorene Ar, [place] 12/17/2015, 9:31 AM  [place], [place], [address] Phone: [phone]   Fax:  [phone]  Name: Taylor Owens MRN: [id number] Date of Birth: 04-19-1965

## 2015-12-20 ENCOUNTER — Ambulatory Visit: Payer: BLUE CROSS/BLUE SHIELD

## 2015-12-20 DIAGNOSIS — M542 Cervicalgia: Secondary | ICD-10-CM

## 2015-12-20 DIAGNOSIS — R252 Cramp and spasm: Secondary | ICD-10-CM

## 2015-12-20 DIAGNOSIS — R293 Abnormal posture: Secondary | ICD-10-CM

## 2015-12-20 NOTE — Therapy (Addendum)
[place], [place], [address] Phone: [phone]   Fax:  [phone]  Physical Therapy Treatment  Patient Details  Name: Taylor Owens MRN: [id number] Date of Birth: 1966/03/20 Referring Provider: Jilda Panda, MD  Encounter Date: 12/20/2015      PT End of Session - 12/20/15 1229    Visit Number 5   Number of Visits 12   Date for PT Re-Evaluation 01/17/16   PT Start Time 2876   PT Stop Time 1210   PT Time Calculation (min) 35 min   Activity Tolerance Patient tolerated treatment well   Behavior During Therapy [facility] for tasks assessed/performed      No past medical history on file.  No past surgical history on file.  There were no vitals filed for this visit.      Subjective Assessment - 12/20/15 1228    Subjective No pain since 2 visits ago. wonder if just better. Less notice of RT forearm symptoms. Doing HEP    Currently in Pain? No/denies           TREATMENT:  UBE L2 3 min for and 3 min back  Self care Instructed in use of home traction unit Tye [place]) and Pt used the device independently for possible use in future.                       PT Short Term Goals - 12/20/15 1231      PT SHORT TERM GOAL #1   Title He will be independent with intial HEP   Status Achieved     PT SHORT TERM GOAL #2   Title He will be able to demo understanding of good posture   Status On-going           PT Long Term Goals - 12/20/15 1231      PT LONG TERM GOAL #1   Title He will be independent with all hEP issued   Status On-going     PT LONG TERM GOAL #2   Title He will report RT forearm symptoms as intermittant   Status On-going     PT LONG TERM GOAL #3   Title He will report neck pain improved  and become intermittant   Status Achieved     PT LONG TERM GOAL #4   Title He will be able to decreased pain   75% or more with all normal activity     Status Achieved                Plan - 12/20/15 1229    Clinical Impression Statement No pain and he feels he is better but does not want discharge yet. We agreed to put him on hold for 3 weeks and if he does not return discharge. He was introduced to [employer] home traction for in future if needed.    PT Treatment/Interventions Cryotherapy;Electrical Stimulation;Moist Heat;Traction;Ultrasound;Passive range of motion;Patient/family education;Manual techniques;Taping;Therapeutic exercise;Dry needling   PT Next Visit Plan Hold x 3 weeks. resume in mean time if needed   PT Home Exercise Plan continue band exercises   Consulted and Agree with Plan of Care Patient      Patient will benefit from skilled therapeutic intervention in order to improve the following deficits and impairments:  Pain, Postural dysfunction, Decreased activity tolerance, Decreased range of motion, Increased muscle spasms  Visit Diagnosis: Cervicalgia  Abnormal posture  Cramp and spasm     Problem List There  are no active problems to display for this patient.   Darrel Hoover  PT 12/20/2015, 12:32 PM  [facility] [address] [place], [place], [address] Phone: [phone]   Fax:  [phone]  Name: Taylor Owens MRN: [id number] Date of Birth: 03/22/1966  PHYSICAL THERAPY DISCHARGE SUMMARY  Visits from Start of Care: 5 Current functional level related to goals / functional outcomes: See above   Remaining deficits: See above    Education / Equipment: HEP Plan: Patient agrees to discharge.  Patient goals were partially met. Patient is being discharged due to not returning since the last visit.  ?????     Taylor Owens  PT  02/11/16  7:42 AM

## 2015-12-23 ENCOUNTER — Ambulatory Visit: Payer: BLUE CROSS/BLUE SHIELD | Admitting: Physical Therapy

## 2015-12-28 ENCOUNTER — Encounter: Payer: BLUE CROSS/BLUE SHIELD | Admitting: Physical Therapy

## 2015-12-31 ENCOUNTER — Encounter: Payer: BLUE CROSS/BLUE SHIELD | Admitting: Physical Therapy

## 2016-01-04 ENCOUNTER — Encounter: Payer: BLUE CROSS/BLUE SHIELD | Admitting: Physical Therapy

## 2018-10-16 ENCOUNTER — Ambulatory Visit: Payer: Self-pay

## 2018-10-16 ENCOUNTER — Other Ambulatory Visit: Payer: Self-pay

## 2018-10-16 ENCOUNTER — Ambulatory Visit (INDEPENDENT_AMBULATORY_CARE_PROVIDER_SITE_OTHER): Payer: BC Managed Care – PPO | Admitting: Orthopedic Surgery

## 2018-10-16 DIAGNOSIS — M5441 Lumbago with sciatica, right side: Secondary | ICD-10-CM | POA: Diagnosis not present

## 2018-10-16 DIAGNOSIS — M4807 Spinal stenosis, lumbosacral region: Secondary | ICD-10-CM

## 2018-10-16 NOTE — Progress Notes (Signed)
Office Visit Note   Patient: Taylor Owens           Date of Birth: Sep 04, 1965           MRN: [id number] Visit Date: 10/16/2018 Requested by: No referring provider defined for this encounter. PCP: Jilda Panda, MD  Subjective: Chief Complaint  Patient presents with  . Right Leg - Pain  Patient seen and examined.  I agree with Luke's note.  Right hip and back pain with radiculopathy.  Longstanding duration.  Failure of conservative management.  Does have some abnormality of that L5 vertebral body on plain radiographs.  Plan MRI lumbar spine to evaluate right radiculopathy with likely ESI to follow.  HPI: Taylor Owens is a 53 y.o. male who presents to the office complaining of R back pain.  Pt states he has a >5 year history of lumbar back pain.  In the last 2 years, he has been experiencing progressive radicular symptoms that radiate from his Rt lumbar spine down his Rt leg to his posterior calf.  He notes pain has now been traveling down his anterior leg to his knee for the last 2 weeks.  Pt denies any injury preceding the onset of this pain.  In the past the pain was intermittent but it is now constant. Pain is worsened by standing, but improved with sitting and walking. It wakes him up at night sometimes. He denies any weakness, saddle anesthesia, or incontinence. Denies any PT, ESI's, surgery on his back, or MRI of his back in the past.    Patient also reports a "clunking" sensation that he localizes to his Rt posterolateral hip.  This sensation comes on when twisting and causes significant discomfort.  Denies association with radicular symptoms.                ROS: All systems reviewed are negative as they relate to the chief complaint within the history of present illness.  Patient denies  fevers or chills.   Assessment & Plan: Visit Diagnoses:  1. Right-sided low back pain with right-sided sciatica, unspecified chronicity   2. Spinal stenosis of lumbosacral region     Plan:  Patient presents to the office with worsening radicular symptoms following a long history of back pain.  He has never really had this worked up.  Exam is positive for nerve root tension signs.  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  Discussed the likely causes of patient's pain and recommended MRI of the L-spine with targeted ESI's following that.  Patient agreed with the plan.  He will f/u with the office .    Follow-Up Instructions: No follow-ups on file.   Orders:  Orders Placed This Encounter  Procedures  . XR Lumbar Spine 2-3 Views  . MR Lumbar Spine w/o contrast   No orders of the defined types were placed in this encounter.     Procedures: No procedures performed   Clinical Data: No additional findings.  Objective: Vital Signs: There were no vitals taken for this visit.  Physical Exam:   Constitutional: Patient appears well-developed HEENT:  Head: Normocephalic Eyes:EOM are normal Neck: Normal range of motion Cardiovascular: Normal rate Pulmonary/chest: Effort normal Neurologic: Patient is alert Skin: Skin is warm Psychiatric: Patient has normal mood and affect    Ortho Exam:  SLR positive on Rt, negative on Lt FADIR sign positive on Rt, negative on Lt No TTP throughout the lumbar  spine. Mild to moderate TTP over the Rt paraspinal muscles of the lumbar spine No TTP over the trochanteric bursas.   No pain with IR of the Rt hip No relief of pain with distraction of the SI joint  Specialty Comments:  No specialty comments available.  Imaging: No results found.   PMFS History: There are no active problems to display for this patient.  No past medical history on file.  No family history on file.   Social History   Occupational History  . Not on file  Tobacco Use  . Smoking status: Not on file  Substance and Sexual Activity  . Alcohol use: Not on file  . Drug use: Not on file  . Sexual  activity: Not on file

## 2018-10-17 ENCOUNTER — Encounter: Payer: Self-pay | Admitting: Orthopedic Surgery

## 2018-10-29 ENCOUNTER — Other Ambulatory Visit: Payer: Self-pay

## 2018-10-29 DIAGNOSIS — M5441 Lumbago with sciatica, right side: Secondary | ICD-10-CM

## 2018-10-29 NOTE — Progress Notes (Signed)
IC s/w patient and advised insurance denied MRI scan because he has not had physical therapy. I advised was going to put an order in for therapy. He lives in [place] and stated that he was going to do some research today to see if he could find a place close to home that he would like to be referred to. He will call me back with this information and we can fax referral at that time.

## 2018-11-13 ENCOUNTER — Other Ambulatory Visit: Payer: BC Managed Care – PPO

## 2018-11-21 ENCOUNTER — Encounter: Payer: Self-pay | Admitting: Physical Therapy

## 2018-11-21 ENCOUNTER — Ambulatory Visit: Payer: BC Managed Care – PPO | Attending: Orthopedic Surgery | Admitting: Physical Therapy

## 2018-11-21 ENCOUNTER — Other Ambulatory Visit: Payer: Self-pay

## 2018-11-21 DIAGNOSIS — G8929 Other chronic pain: Secondary | ICD-10-CM | POA: Insufficient documentation

## 2018-11-21 DIAGNOSIS — M5441 Lumbago with sciatica, right side: Secondary | ICD-10-CM | POA: Diagnosis present

## 2018-11-21 NOTE — Therapy (Signed)
[place], [place], [address] Phone: [phone]   Fax:  [phone]  Physical Therapy Evaluation  Patient Details  Name: Taylor Owens MRN: [id number] Date of Birth: 02-16-1966 Referring Provider (PT): Meredith Pel, MD   Encounter Date: 11/21/2018  PT End of Session - 11/21/18 2103    Visit Number  1    Number of Visits  10    Date for PT Re-Evaluation  01/02/19    Authorization Type  BCBS    PT Start Time  7209    PT Stop Time  1630    PT Time Calculation (min)  45 min    Activity Tolerance  Patient tolerated treatment well    Behavior During Therapy  [facility] for tasks assessed/performed       History reviewed. No pertinent past medical history.  History reviewed. No pertinent surgical history.  There were no vitals filed for this visit.   Subjective Assessment - 11/21/18 2047    Subjective  Pt. is a 53 y/o male referred to PT for c/o chronic right lumbar pain with associated RLE radiating pain. Pt. reports he has had at least 5 year history of chronic intermittent symptoms most recently exacerbated last month, no specific mechanism of injury but notes tendency for "popping" sensation in right posterolateral hip with movements involving trunk flexion associated with subsequent symptom provocation. Symptoms eased with sitting and standing, walking, and exacerbated with standing with right lateral weightshift was well as positional changes. No past therapy. MRI ordered by MD but pt. has to try PT before insurance will cover.    Pertinent History  5 year symptom history    Limitations  Lifting;House hold activities   initial difficulty standing but eases with prolonged standing or walking   Diagnostic tests  X-rays-per notes revealed L5 spondylolisthesis    Patient Stated Goals  Resolve pain    Currently in Pain?  Yes    Pain Score  3     Pain Location  Back    Pain Orientation  Right;Lower    Pain Descriptors / Indicators  Sharp    Pain Type  Chronic pain    Pain Radiating Towards  right posterolateral hip to knee, has had symptoms to calf but nothing more distal than mid-calf and primarily to thigh    Pain Onset  More than a month ago    Pain Frequency  Intermittent    Aggravating Factors   positional changes, bending, right weightshift    Pain Relieving Factors  standing and walking, sitting         [facility] PT Assessment - 11/21/18 0001      Assessment   Medical Diagnosis  Chronic right LBP with sciatica    Referring Provider (PT)  Meredith Pel, MD    Onset Date/Surgical Date  --   onset 2015, exacerbated 7/20   Hand Dominance  Right    Prior Therapy  none      Precautions   Precautions  None      Restrictions   Weight Bearing Restrictions  No      Balance Screen   Has the patient fallen in the past 6 months  No      Prior Function   Level of Independence  Independent with basic ADLs      Cognition   Overall Cognitive Status  Within Functional Limits for tasks assessed      Observation/Other Assessments   Focus on  Therapeutic Outcomes (FOTO)   39% limited      Sensation   Light Touch  Appears Intact   L2-S2 dermatomes     ROM / Strength   AROM / PROM / Strength  AROM;PROM;Strength      AROM   Overall AROM Comments  Hip AROM/PROM grossly WFL bilat.    AROM Assessment Site  Hip;Lumbar    Lumbar Flexion  90    Lumbar Extension  30    Lumbar - Right Side Bend  30    Lumbar - Left Side Bend  30    Lumbar - Right Rotation  WFL    Lumbar - Left Rotation  [facility]      Strength   Overall Strength Comments  Bilat. LE MMTs grossly 5/5 excepting right glut/hip extension 4/5      Palpation   Palpation comment  TTP with localized pain right glut region      Special Tests   Other special tests  SLR (-), test item cluser for SI pain (+) for at least 3/5 with FABER, Gaenslan's and thigh thrust, longsitting test (+) for right anterior innominate rotation                 Objective measurements completed on examination: See above findings.      [place] Adult PT Treatment/Exercise - 11/21/18 0001      Manual Therapy   Manual Therapy  Muscle Energy Technique    Muscle Energy Technique  correction of right anterior innominate rotation with right hamstring curl and left hip flexor isometrics 5x5", "shotgun" MET 5x5" ea.       Trigger Point Dry Needling - 11/21/18 0001    Consent Given?  Yes    Education Handout Provided  Yes    Muscles Treated Back/Hip  Gluteus maximus    Dry Needling Comments  60 mm 32 gauge needle in sidelying           PT Education - 11/21/18 2102    Education Details  potential symptom etiology, HEP for self-METs, POC    Person(s) Educated  Patient    Methods  Explanation;Demonstration;Verbal cues;Tactile cues    Comprehension  Verbalized understanding;Returned demonstration          PT Long Term Goals - 11/21/18 2111      PT LONG TERM GOAL #1   Title  Improve FOTO score to 32% or less impaired    Baseline  39% limited    Time  6    Period  Weeks    Status  New    Target Date  01/02/19      PT LONG TERM GOAL #2   Title  Increase right glut/hip extension strength 1/2 MMT grade to improve strength for lifting activities    Baseline  4/5    Time  6    Period  Weeks    Status  New    Target Date  01/02/19      PT LONG TERM GOAL #3   Title  Pt. to perform sit<>stands, positional changes with right lumbar/RLE pain 3/10 or less at worst    Baseline  7-9/10 at worst    Time  6    Period  Weeks    Status  New    Target Date  01/02/19      PT LONG TERM GOAL #4   Title  Independent with advanced HEP for continued progress after therapy d/c    Baseline  needs instruction  Time  6    Period  Weeks    Status  Achieved    Target Date  01/02/19             Plan - 11/21/18 2103    Clinical Impression Statement  Pt. presents with recent exacerbation of chronic right LBP/right  posterolateral hip pain with intermittent radiating to right thigh and mid-calf. Differential diagnosis could include discogenic pain vs. stenosis and/or associated with anterolisthesis noted per X-rays but given factors including pain associated with position changes and eased respectively with standing and walking vs. sitting which would be atypical respectively for stenosis or discogenic pain (also with (-) SLR and no history of symptoms to foot) along with (+) test item cluster for SI etiology and (+) longsitting test suspect SI etiology. Pt. would benefit from PT to help relieve pain and address currentr associated functional limitations.    Personal Factors and Comorbidities  Time since onset of injury/illness/exacerbation    Examination-Activity Limitations  Bend;Stand;Sit   initial sit vs. stand-symptoms eased with more prolonged positioning   Examination-Participation Restrictions  Cleaning;Community Activity    Stability/Clinical Decision Making  Evolving/Moderate complexity    Clinical Decision Making  Moderate    Rehab Potential  Fair    PT Frequency  --   1-2x/week   PT Duration  6 weeks    PT Treatment/Interventions  Spinal Manipulations;Taping;Dry needling;Manual techniques;Patient/family education;Neuromuscular re-education;Therapeutic activities;Therapeutic exercise;Electrical Stimulation;Cryotherapy;Moist Heat;Traction;ADLs/Self Care Home Management    PT Next Visit Plan  check for innominate rotation/response METs and correct rotation if noted, add TA contraction, bridge vs. R SL bridge, clamshells, hamstring and glut/piriformis stretches, potential further dry needling to right glut region    PT Home Exercise Plan  self METs for right anterior innominate rotation and "shotgun"    Consulted and Agree with Plan of Care  Patient       Patient will benefit from skilled therapeutic intervention in order to improve the following deficits and impairments:  Pain, Impaired flexibility,  Decreased strength, Increased muscle spasms  Visit Diagnosis: Chronic right-sided low back pain with right-sided sciatica     Problem List There are no active problems to display for this patient.  Beaulah Dinning, PT, DPT 11/21/18 9:15 PM  [facility] [address] [place], [place], [address] Phone: [phone]   Fax:  [phone]  Name: Taylor Owens MRN: [id number] Date of Birth: 07-21-1965

## 2018-12-12 ENCOUNTER — Ambulatory Visit: Payer: BC Managed Care – PPO | Admitting: Physical Therapy

## 2018-12-19 ENCOUNTER — Ambulatory Visit: Payer: BC Managed Care – PPO | Attending: Orthopedic Surgery | Admitting: Physical Therapy

## 2018-12-19 ENCOUNTER — Encounter: Payer: Self-pay | Admitting: Physical Therapy

## 2018-12-19 ENCOUNTER — Other Ambulatory Visit: Payer: Self-pay

## 2018-12-19 DIAGNOSIS — M5441 Lumbago with sciatica, right side: Secondary | ICD-10-CM | POA: Diagnosis present

## 2018-12-19 DIAGNOSIS — G8929 Other chronic pain: Secondary | ICD-10-CM | POA: Insufficient documentation

## 2018-12-19 NOTE — Therapy (Signed)
[place], [place], [address] Phone: [phone]   Fax:  [phone]  Physical Therapy Treatment  Patient Details  Name: Taylor Owens MRN: [id number] Date of Birth: [birth year] Referring Provider (PT): Meredith Pel, MD   Encounter Date: 12/19/2018  PT End of Session - 12/19/18 1614    Visit Number  2    Number of Visits  10    Date for PT Re-Evaluation  01/02/19    Authorization Type  BCBS    PT Start Time  6606    PT Stop Time  1613    PT Time Calculation (min)  42 min    Activity Tolerance  Patient tolerated treatment well    Behavior During Therapy  [facility] for tasks assessed/performed       History reviewed. No pertinent past medical history.  History reviewed. No pertinent surgical history.  There were no vitals filed for this visit.  Subjective Assessment - 12/19/18 1533    Subjective  Pt. reports tx. at eval helped for about 4 days but pain returned and today 2/10 right lumbosacral region. Still having some "popping" in right hip region but less "knot" in glut after tx.    Pertinent History  5 year symptom history    Limitations  Lifting;House hold activities    Diagnostic tests  X-rays-per notes revealed L5 spondylolisthesis    Patient Stated Goals  Resolve pain    Currently in Pain?  Yes    Pain Score  2     Pain Location  Back    Pain Orientation  Right;Lower    Pain Descriptors / Indicators  Sharp    Pain Type  Chronic pain    Pain Onset  More than a month ago    Pain Frequency  Intermittent    Aggravating Factors   positional changes, bending, right weightshift    Pain Relieving Factors  standing and walking, sitting         [facility] PT Assessment - 12/19/18 0001      Special Tests   Other special tests  right anterior innominate rotation noted                   [facility] Adult PT Treatment/Exercise - 12/19/18 0001      Exercises   Exercises  Lumbar       Lumbar Exercises: Stretches   Passive Hamstring Stretch  Right;Left;2 reps;30 seconds    Pelvic Tilt  15 reps    Piriformis Stretch  Right;3 reps;30 seconds    Figure 4 Stretch  3 reps;30 seconds    Figure 4 Stretch Limitations  right side supine manual stretch      Lumbar Exercises: Supine   Clam  15 reps    Clam Limitations  blue band alt. unilat.    Bent Knee Raise  15 reps    Bridge with [employer]  15 reps      Manual Therapy   Manual Therapy  Joint mobilization    Joint Mobilization  LAD bilat. hips grade III-IV, grade V SI joint mobilization bilat.    Muscle Energy Technique  correction of right anterior innominate rotation with right hamstring curl and left hip flexor isometrics 10x5", "shotgun" MET 5x5" ea.             PT Education - 12/19/18 1613    Education Details  exercises, POC    Person(s) Educated  Patient    Methods  Explanation;Demonstration    Comprehension  Verbalized understanding;Returned demonstration          PT Long Term Goals - 11/21/18 2111      PT LONG TERM GOAL #1   Title  Improve FOTO score to 32% or less impaired    Baseline  39% limited    Time  6    Period  Weeks    Status  New    Target Date  01/02/19      PT LONG TERM GOAL #2   Title  Increase right glut/hip extension strength 1/2 MMT grade to improve strength for lifting activities    Baseline  4/5    Time  6    Period  Weeks    Status  New    Target Date  01/02/19      PT LONG TERM GOAL #3   Title  Pt. to perform sit<>stands, positional changes with right lumbar/RLE pain 3/10 or less at worst    Baseline  7-9/10 at worst    Time  6    Period  Weeks    Status  New    Target Date  01/02/19      PT LONG TERM GOAL #4   Title  Independent with advanced HEP for continued progress after therapy d/c    Baseline  needs instruction    Time  6    Period  Weeks    Status  Achieved    Target Date  01/02/19            Plan - 12/19/18 1617    Clinical Impression  Statement  Good response to initial tx. considering chronicity and severity of initial symptoms. Pt. returns today again with right anterior innominate rotation but less pronunced this time and showing progress with decreased pain and functional gains for positional tolerance.    Personal Factors and Comorbidities  Time since onset of injury/illness/exacerbation    Examination-Activity Limitations  Bend;Stand;Sit    Examination-Participation Restrictions  Cleaning;Community Activity    Stability/Clinical Decision Making  Evolving/Moderate complexity    Clinical Decision Making  Moderate    Rehab Potential  Fair    PT Frequency  --   1-2x/week   PT Treatment/Interventions  Spinal Manipulations;Taping;Dry needling;Manual techniques;Patient/family education;Neuromuscular re-education;Therapeutic activities;Therapeutic exercise;Electrical Stimulation;Cryotherapy;Moist Heat;Traction;ADLs/Self Care Home Management    PT Next Visit Plan  check for innominate rotation/response METs and correct rotation if noted, add TA contraction, bridge vs. R SL bridge, clamshells, hamstring and glut/piriformis stretches, potential further dry needling to right glut region    PT Home Exercise Plan  self METs for right anterior innominate rotation and "shotgun"    Consulted and Agree with Plan of Care  Patient       Patient will benefit from skilled therapeutic intervention in order to improve the following deficits and impairments:  Pain, Impaired flexibility, Decreased strength, Increased muscle spasms  Visit Diagnosis: Chronic right-sided low back pain with right-sided sciatica     Problem List There are no active problems to display for this patient.   Beaulah Dinning, PT, DPT 12/19/18 4:20 PM  [facility] [address] [place], [place], [address] Phone: [phone]   Fax:  [phone]  Name: Taylor Owens MRN: [id number] Date of Birth:  22-Mar-1966

## 2018-12-23 ENCOUNTER — Ambulatory Visit: Payer: BC Managed Care – PPO | Admitting: Physical Therapy

## 2018-12-23 ENCOUNTER — Other Ambulatory Visit: Payer: Self-pay

## 2018-12-23 ENCOUNTER — Encounter: Payer: Self-pay | Admitting: Physical Therapy

## 2018-12-23 DIAGNOSIS — M5441 Lumbago with sciatica, right side: Secondary | ICD-10-CM | POA: Diagnosis not present

## 2018-12-23 DIAGNOSIS — G8929 Other chronic pain: Secondary | ICD-10-CM

## 2018-12-23 NOTE — Therapy (Signed)
[place], [place], [address] Phone: [phone]   Fax:  [phone]  Physical Therapy Treatment  Patient Details  Name: Taylor Owens MRN: [id number] Date of Birth: 06-26-1965 Referring Provider (PT): Meredith Pel, MD   Encounter Date: 12/23/2018  PT End of Session - 12/23/18 1719    Visit Number  3    Number of Visits  10    Date for PT Re-Evaluation  01/02/19    Authorization Type  BCBS    PT Start Time  1630    PT Stop Time  1713    PT Time Calculation (min)  43 min    Activity Tolerance  Patient tolerated treatment well    Behavior During Therapy  [facility] for tasks assessed/performed       History reviewed. No pertinent past medical history.  History reviewed. No pertinent surgical history.  There were no vitals filed for this visit.  Subjective Assessment - 12/23/18 1641    Subjective  Pt. reports pain intensity is about the same but notes continued "popping" in right posterolateral hip region-localizes pain in right glut/piriformis.    Diagnostic tests  X-rays-per notes revealed L5 spondylolisthesis    Patient Stated Goals  Resolve pain    Currently in Pain?  Yes    Pain Score  2     Pain Location  Back    Pain Orientation  Right;Lower    Pain Descriptors / Indicators  Sharp    Pain Type  Chronic pain    Pain Onset  More than a month ago    Pain Frequency  Intermittent    Aggravating Factors   positional changes, bening, rigth weightshift    Pain Relieving Factors  standing and walking, sitting         [facility] PT Assessment - 12/23/18 0001      Special Tests   Other special tests  right posterior innominate rotation noted                   [facility] Adult PT Treatment/Exercise - 12/23/18 0001      Lumbar Exercises: Stretches   Piriformis Stretch  Right;3 reps;30 seconds    Figure 4 Stretch  3 reps;30 seconds      Lumbar Exercises: Supine   Clam  15 reps    Clam  Limitations  blue band    Bridge  15 reps    Other Supine Lumbar Exercises  hip adduction isometric 5" x 15 reps      Manual Therapy   Manual therapy comments  STM right piriformis/glut region incl. roller use    Joint Mobilization  LAD right hip grade I-IV    Muscle Energy Technique  MET: correction of right posterior innominate rotation 2x5 sec incl. use of stick       Trigger Point Dry Needling - 12/23/18 0001    Consent Given?  Yes    Muscles Treated Back/Hip  Gluteus maximus;Piriformis    Dry Needling Comments  needled in left sidelying with 60 mm 30 gauge needles x 2    Electrical Stimulation Performed with Dry Needling  Yes    E-stim with Dry Needling Details  TENS 20 HZ x 10 minutes                PT Long Term Goals - 11/21/18 2111      PT LONG TERM GOAL #1   Title  Improve FOTO score to 32% or  less impaired    Baseline  39% limited    Time  6    Period  Weeks    Status  New    Target Date  01/02/19      PT LONG TERM GOAL #2   Title  Increase right glut/hip extension strength 1/2 MMT grade to improve strength for lifting activities    Baseline  4/5    Time  6    Period  Weeks    Status  New    Target Date  01/02/19      PT LONG TERM GOAL #3   Title  Pt. to perform sit<>stands, positional changes with right lumbar/RLE pain 3/10 or less at worst    Baseline  7-9/10 at worst    Time  6    Period  Weeks    Status  New    Target Date  01/02/19      PT LONG TERM GOAL #4   Title  Independent with advanced HEP for continued progress after therapy d/c    Baseline  needs instruction    Time  6    Period  Weeks    Status  Achieved    Target Date  01/02/19            Plan - 12/23/18 1722    Clinical Impression Statement  Pt. localizes pain as noted in subjective to right piriformis/glut region so performed STM and stretches as well as brief dry needling to address. Previous innominate rotation was right anterior but today pt. presented with right  posterior rotation which able to correct with METs. Variable progress and status session to session but benefit noted with tx. so plan continue therapy per POC from eval for further progress re: therapy goals.    Personal Factors and Comorbidities  Time since onset of injury/illness/exacerbation    Examination-Activity Limitations  Bend;Stand;Sit    Examination-Participation Restrictions  Cleaning;Community Activity    Stability/Clinical Decision Making  Evolving/Moderate complexity    Clinical Decision Making  Moderate    Rehab Potential  Fair    PT Frequency  --   1-2x/week   PT Duration  6 weeks    PT Treatment/Interventions  Spinal Manipulations;Taping;Dry needling;Manual techniques;Patient/family education;Neuromuscular re-education;Therapeutic activities;Therapeutic exercise;Electrical Stimulation;Cryotherapy;Moist Heat;Traction;ADLs/Self Care Home Management    PT Next Visit Plan  check for innominate rotation/response METs and correct rotation if noted, add TA contraction, bridge vs. R SL bridge, clamshells, hamstring and glut/piriformis stretches, potential further dry needling to right glut region    PT Home Exercise Plan  self METs for right anterior innominate rotation and "shotgun"    Consulted and Agree with Plan of Care  Patient       Patient will benefit from skilled therapeutic intervention in order to improve the following deficits and impairments:  Pain, Impaired flexibility, Decreased strength, Increased muscle spasms  Visit Diagnosis: Chronic right-sided low back pain with right-sided sciatica     Problem List There are no active problems to display for this patient.    Beaulah Dinning, PT, DPT 12/23/18 5:28 PM  [facility] : [facility] [address] [place], [place], [address] Phone: [phone]   Fax:  [phone]  Name: Taylor Owens MRN: [id number] Date of Birth: Aug 29, 1965

## 2019-01-02 ENCOUNTER — Ambulatory Visit: Payer: BC Managed Care – PPO | Admitting: Physical Therapy

## 2019-01-08 ENCOUNTER — Ambulatory Visit: Payer: BC Managed Care – PPO | Attending: Orthopedic Surgery | Admitting: Physical Therapy

## 2019-01-08 ENCOUNTER — Encounter: Payer: Self-pay | Admitting: Physical Therapy

## 2019-01-08 ENCOUNTER — Other Ambulatory Visit: Payer: Self-pay

## 2019-01-08 DIAGNOSIS — M5441 Lumbago with sciatica, right side: Secondary | ICD-10-CM | POA: Diagnosis not present

## 2019-01-08 DIAGNOSIS — G8929 Other chronic pain: Secondary | ICD-10-CM | POA: Diagnosis present

## 2019-01-08 NOTE — Therapy (Signed)
[facility] [address] [place], [place], [address] Phone: [phone]   Fax:  [phone]  Physical Therapy Treatment  Patient Details  Name: Taylor Owens MRN: [id number] Date of Birth: 11/04/1965 Referring Provider (PT): Gregory Scott Dean, MD   Encounter Date: 01/08/2019  PT End of Session - 01/08/19 1634    Visit Number  4    Number of Visits  12    Date for PT Re-Evaluation  02/19/19    Authorization Type  BCBS    PT Start Time  1631    PT Stop Time  1714    PT Time Calculation (min)  43 min    Activity Tolerance  Patient tolerated treatment well    Behavior During Therapy  WFL for tasks assessed/performed       History reviewed. No pertinent past medical history.  History reviewed. No pertinent surgical history.  There were no vitals filed for this visit.  Subjective Assessment - 01/08/19 2052    Subjective  Pt. returns for his 4th PT visit unable to be seen last week due to his work schedule. He reports his back had been doing well after last therapy visit with previous c/o right lower lumbar and hip pain but he reports significant exacerbation of low back pain his time involving left upper gluteal region last Friday. No specific mechanism of injury noted but her reports pain became so bad on Friday that he was grocery shopping and had to leave cart in store and go home due to pain. Pain is since less severe but currently with c/o left uppee buttock region pain.    Pertinent History  5 year symptom history    Limitations  Lifting;House hold activities    Diagnostic tests  X-rays-per notes revealed L5 spondylolisthesis    Patient Stated Goals  Resolve pain    Currently in Pain?  Yes    Pain Score  3     Pain Location  Back    Pain Orientation  Left    Pain Type  Acute pain   acute on chronic   Pain Radiating Towards  left upper buttock    Pain Onset  In the past 7 days   acute on chronic   Pain Frequency   Constant    Aggravating Factors   walking and sitting         [facility] PT Assessment - 01/08/19 0001      Observation/Other Assessments   Focus on Therapeutic Outcomes (FOTO)   65% limited      Strength   Overall Strength Comments  Right hip abduction and extension 5/5, left hip abduction 3+/5, left hip extension 4-/5 tested in sidelying                   [facility] Adult PT Treatment/Exercise - 01/08/19 0001      Lumbar Exercises: Stretches   Single Knee to Chest Stretch  Left;3 reps;20 seconds    Lower Trunk Rotation  --   x10 ea. way bilat.   Piriformis Stretch  Left;3 reps;30 seconds    Piriformis Stretch Limitations  supine manual stretch    Figure 4 Stretch  3 reps;30 seconds    Figure 4 Stretch Limitations  supine manual stretch on left    Other Lumbar Stretch Exercise  HEP instruction/review left glut and piriformis stretch      Manual Therapy   Manual therapy comments  STM left glut/posterolateral hip region in left sidelying         Trigger Point Dry Needling - 01/08/19 0001    Consent Given?  Yes    Muscles Treated Back/Hip  Gluteus maximus    Dry Needling Comments  Left upper glut needled in right sidelying with 30 gauge 75 mm needles    Electrical Stimulation Performed with Dry Needling  Yes    E-stim with Dry Needling Details  TENS 2 pps x 10 minutes           PT Education - 01/08/19 2053    Education Details  POC    Person(s) Educated  Patient    Methods  Explanation    Comprehension  Verbalized understanding          PT Long Term Goals - 01/08/19 2059      PT LONG TERM GOAL #1   Title  Improve FOTO score to 32% or less impaired    Baseline  65% limited    Time  6    Period  Weeks    Status  On-going    Target Date  02/19/19      PT LONG TERM GOAL #2   Title  Increase right glut/hip extension strength 1/2 MMT grade to improve strength for lifting activities    Baseline  met for right glut, revise goal for left glut which was 3+/5  for abduction and 4-/5 extension    Time  6    Period  Weeks    Status  Revised    Target Date  02/19/19      PT LONG TERM GOAL #3   Title  Pt. to perform sit<>stands, positional changes with lumbar and bilat. hip pain 3/10 or less at worst    Baseline  revised for bilat. sides    Time  6    Period  Weeks    Status  On-going    Target Date  02/19/19      PT LONG TERM GOAL #4   Title  Independent with advanced HEP for continued progress after therapy d/c    Baseline  ongoing    Time  6    Period  Weeks    Status  On-going            Plan - 01/08/19 2054    Clinical Impression Statement  Pt. initially improving with previous therapy visits but with significant exacerbation of symptoms last Friday-no specific mechanism of injury but primary symptoms local to left glut region with associated tenderness to palpation/palpable local muscle spasm and no LE radiating symptoms past hip. Symptoms also on contralateral side from previous pain/potential radicular symptoms. Differential diagnosis could include radiculopathy but presentation today most consistent with muscle strain involving left glut region. Given recent improvement with therapy plan resume/continue therapy but if failing to improve recommened follow up with MD for further assessment.    Personal Factors and Comorbidities  --   history chronic back pain x 5 years   Examination-Activity Limitations  Bend;Stand;Sit    Examination-Participation Restrictions  Cleaning;Community Activity    Stability/Clinical Decision Making  Evolving/Moderate complexity    Clinical Decision Making  Moderate    Rehab Potential  Good    PT Frequency  --   1-2x/week   PT Duration  6 weeks    PT Treatment/Interventions  Spinal Manipulations;Taping;Dry needling;Manual techniques;Patient/family education;Neuromuscular re-education;Therapeutic activities;Therapeutic exercise;Electrical Stimulation;Cryotherapy;Moist Heat;Traction;ADLs/Self Care Home  Management    PT Next Visit Plan  check innominate rotation, STM left glut region, potential further dry needling, lumbopelvic/SI stabilization, hip distraction      PT Home Exercise Plan  self METs for right anterior innominate rotation and "shotgun", pelvic tilt, hip bridge, clamshell, hip add. isometric    Consulted and Agree with Plan of Care  Patient       Patient will benefit from skilled therapeutic intervention in order to improve the following deficits and impairments:  Pain, Impaired flexibility, Decreased strength, Increased muscle spasms  Visit Diagnosis: Chronic right-sided low back pain with right-sided sciatica     Problem List There are no active problems to display for this patient.  Beaulah Dinning, PT, DPT 01/08/19 9:01 PM  [facility] [address] [place], [place], [address] Phone: [phone]   Fax:  [phone]  Name: Taylor Owens MRN: [id number] Date of Birth: [birth year]

## 2019-02-07 NOTE — Therapy (Signed)
[place], [place], [address] Phone: [phone]   Fax:  [phone]  Physical Therapy Treatment/Discharge  Patient Details  Name: Taylor Owens MRN: [id number] Date of Birth: 1965-11-06 Referring Provider (PT): Meredith Pel, MD   Encounter Date: 01/08/2019    History reviewed. No pertinent past medical history.  History reviewed. No pertinent surgical history.  There were no vitals filed for this visit.                                 PT Long Term Goals - 01/08/19 2059      PT LONG TERM GOAL #1   Title  Improve FOTO score to 32% or less impaired    Baseline  65% limited    Time  6    Period  Weeks    Status  On-going    Target Date  02/19/19      PT LONG TERM GOAL #2   Title  Increase right glut/hip extension strength 1/2 MMT grade to improve strength for lifting activities    Baseline  met for right glut, revise goal for left glut which was 3+/5 for abduction and 4-/5 extension    Time  6    Period  Weeks    Status  Revised    Target Date  02/19/19      PT LONG TERM GOAL #3   Title  Pt. to perform sit<>stands, positional changes with lumbar and bilat. hip pain 3/10 or less at worst    Baseline  revised for bilat. sides    Time  6    Period  Weeks    Status  On-going    Target Date  02/19/19      PT LONG TERM GOAL #4   Title  Independent with advanced HEP for continued progress after therapy d/c    Baseline  ongoing    Time  6    Period  Weeks    Status  On-going              Patient will benefit from skilled therapeutic intervention in order to improve the following deficits and impairments:  Pain, Impaired flexibility, Decreased strength, Increased muscle spasms  Visit Diagnosis: Chronic right-sided low back pain with right-sided sciatica - Plan: PT plan of care cert/re-cert     Problem List There are no active problems to  display for this patient.     PHYSICAL THERAPY DISCHARGE SUMMARY  Visits from Start of Care: 4  Current functional level related to goals / functional outcomes: Patient did not return for further therapy after last session 01/08/19.    Remaining deficits: Current status unknown   Education / Equipment: Plan at last visit was to follow up with MD for further assessment if continuing to have significant LBP/associated limitations  Plan: Patient agrees to discharge.  Patient goals were not met. Patient is being discharged due to not returning since the last visit.  ?????           Beaulah Dinning, PT, DPT 02/07/19 9:16 AM    [facility] [address] [place], [place], [address] Phone: [phone]   Fax:  [phone]  Name: Taylor Owens MRN: [id number] Date of Birth: [birth year]

## 2019-11-04 ENCOUNTER — Other Ambulatory Visit: Payer: Self-pay | Admitting: Internal Medicine

## 2019-11-04 DIAGNOSIS — F17210 Nicotine dependence, cigarettes, uncomplicated: Secondary | ICD-10-CM

## 2020-04-01 ENCOUNTER — Other Ambulatory Visit: Payer: Self-pay | Admitting: Internal Medicine

## 2020-04-01 DIAGNOSIS — F17210 Nicotine dependence, cigarettes, uncomplicated: Secondary | ICD-10-CM

## 2020-04-16 ENCOUNTER — Other Ambulatory Visit: Payer: Self-pay | Admitting: Internal Medicine

## 2020-04-16 DIAGNOSIS — F17210 Nicotine dependence, cigarettes, uncomplicated: Secondary | ICD-10-CM

## 2020-04-30 ENCOUNTER — Other Ambulatory Visit: Payer: BC Managed Care – PPO

## 2020-05-14 ENCOUNTER — Other Ambulatory Visit: Payer: Self-pay

## 2020-08-06 ENCOUNTER — Other Ambulatory Visit: Payer: Self-pay | Admitting: Surgery

## 2020-12-16 ENCOUNTER — Ambulatory Visit: Payer: Self-pay | Admitting: Cardiology

## 2020-12-24 ENCOUNTER — Ambulatory Visit: Payer: Self-pay | Admitting: Cardiology

## 2020-12-24 DIAGNOSIS — R000 Tachycardia, unspecified: Secondary | ICD-10-CM | POA: Insufficient documentation

## 2020-12-24 NOTE — Progress Notes (Signed)
No show

## 2021-11-03 ENCOUNTER — Other Ambulatory Visit: Payer: Self-pay | Admitting: Internal Medicine

## 2021-11-03 DIAGNOSIS — R109 Unspecified abdominal pain: Secondary | ICD-10-CM

## 2021-11-03 DIAGNOSIS — R1030 Lower abdominal pain, unspecified: Secondary | ICD-10-CM

## 2021-12-02 ENCOUNTER — Ambulatory Visit
Admission: RE | Admit: 2021-12-02 | Discharge: 2021-12-02 | Disposition: A | Discharge status: Home / Self Care | Payer: 59 | Source: Ambulatory Visit | Attending: Internal Medicine | Admitting: Internal Medicine

## 2021-12-02 DIAGNOSIS — R1030 Lower abdominal pain, unspecified: Secondary | ICD-10-CM

## 2021-12-02 DIAGNOSIS — R109 Unspecified abdominal pain: Secondary | ICD-10-CM

## 2021-12-02 MED ORDER — IOPAMIDOL (ISOVUE-300) INJECTION 61%
100.0000 mL | Freq: Once | INTRAVENOUS | Status: AC | PRN
  Administered 2021-12-02: 100 mL via INTRAVENOUS

## 2022-05-24 ENCOUNTER — Ambulatory Visit: Payer: Managed Care, Other (non HMO)

## 2022-10-10 ENCOUNTER — Other Ambulatory Visit (HOSPITAL_COMMUNITY): Payer: Self-pay | Admitting: Gastroenterology

## 2022-10-10 DIAGNOSIS — R1011 Right upper quadrant pain: Secondary | ICD-10-CM

## 2022-10-31 ENCOUNTER — Encounter (HOSPITAL_COMMUNITY): Payer: Managed Care, Other (non HMO)

## 2022-11-15 ENCOUNTER — Encounter (HOSPITAL_COMMUNITY): Payer: Self-pay

## 2022-11-15 ENCOUNTER — Encounter (HOSPITAL_COMMUNITY): Payer: Managed Care, Other (non HMO)
